# Patient Record
Sex: Female | Born: 1965 | Marital: Married | Smoking: Current every day smoker
Health system: Southern US, Community
[De-identification: ages and names within clinical notes are randomized; demographics above are authoritative.]

## PROBLEM LIST (undated history)

## (undated) DIAGNOSIS — I10 Essential (primary) hypertension: Secondary | ICD-10-CM

## (undated) DIAGNOSIS — C801 Malignant (primary) neoplasm, unspecified: Secondary | ICD-10-CM

## (undated) HISTORY — PX: COLON SURGERY: SHX602

---

## 2015-05-26 ENCOUNTER — Emergency Department
Admission: EM | Admit: 2015-05-26 | Discharge: 2015-05-26 | Disposition: A | Discharge status: Home / Self Care | Payer: Self-pay | Attending: Emergency Medicine | Admitting: Emergency Medicine

## 2015-05-26 ENCOUNTER — Emergency Department: Payer: Self-pay

## 2015-05-26 DIAGNOSIS — M5412 Radiculopathy, cervical region: Secondary | ICD-10-CM | POA: Insufficient documentation

## 2015-05-26 DIAGNOSIS — Z79899 Other long term (current) drug therapy: Secondary | ICD-10-CM | POA: Insufficient documentation

## 2015-05-26 DIAGNOSIS — Z880 Allergy status to penicillin: Secondary | ICD-10-CM | POA: Insufficient documentation

## 2015-05-26 DIAGNOSIS — I10 Essential (primary) hypertension: Secondary | ICD-10-CM | POA: Insufficient documentation

## 2015-05-26 DIAGNOSIS — Z720 Tobacco use: Secondary | ICD-10-CM | POA: Insufficient documentation

## 2015-05-26 HISTORY — DX: Malignant (primary) neoplasm, unspecified: C80.1

## 2015-05-26 HISTORY — DX: Essential (primary) hypertension: I10

## 2015-05-26 LAB — BASIC METABOLIC PANEL
Anion gap: 7 (ref 5–15)
BUN: 13 mg/dL (ref 6–20)
CALCIUM: 9 mg/dL (ref 8.9–10.3)
CHLORIDE: 107 mmol/L (ref 101–111)
CO2: 25 mmol/L (ref 22–32)
CREATININE: 0.77 mg/dL (ref 0.44–1.00)
GFR calc Af Amer: >60 mL/min (ref >60)
GFR calc non Af Amer: >60 mL/min (ref >60)
GLUCOSE: 98 mg/dL (ref 65–99)
Potassium: 3.6 mmol/L (ref 3.5–5.1)
Sodium: 139 mmol/L (ref 135–145)

## 2015-05-26 LAB — CBC
HCT: 40.4 % (ref 35.0–47.0)
Hemoglobin: 13.7 g/dL (ref 12.0–16.0)
MCH: 29.7 pg (ref 26.0–34.0)
MCHC: 34 g/dL (ref 32.0–36.0)
MCV: 87.3 fL (ref 80.0–100.0)
PLATELETS: 315 10*3/uL (ref 150–440)
RBC: 4.63 MIL/uL (ref 3.80–5.20)
RDW: 13.6 % (ref 11.5–14.5)
WBC: 13.4 10*3/uL — ABNORMAL HIGH (ref 3.6–11.0)

## 2015-05-26 LAB — TROPONIN I

## 2015-05-26 MED ORDER — NAPROXEN 500 MG PO TABS: 500.0000 mg | ORAL_TABLET | Freq: Two times a day (BID) | ORAL | Status: AC

## 2015-05-26 MED ORDER — HYDROCODONE-ACETAMINOPHEN 5-325 MG PO TABS
ORAL_TABLET | ORAL | Status: AC
  Filled 2015-05-26: qty 1

## 2015-05-26 MED ORDER — TRAMADOL HCL 50 MG PO TABS
50.0000 mg | ORAL_TABLET | Freq: Once | ORAL | Status: AC
Start: 2015-05-26 — End: 2015-05-26
  Administered 2015-05-26: 50 mg via ORAL
  Filled 2015-05-26: qty 1

## 2015-05-26 NOTE — ED Provider Notes (Signed)
[facility] Emergency Department Provider Note  ____________________________________________  Time seen: 7:30 PM  I have reviewed the triage vital signs and the nursing notes.   HISTORY  Chief Complaint Chest Pain    HPI Terri Barber is a 49 y.o. female who presents with complaints of left-sided neck pain when she woke up this morning at approximately 2 AM. She felt some mild tingling in her left arm which made her anxious and then she developed reflux sensations in her chest which have long since resolved. She denies heaviness or weakness in the arm. She denies fevers chills. She continues to have the posterior left-sided neck pain with tingling in her arm. She is never had this before. She denies injury or trauma. She denies repetitive use.She denies a history of heart disease     Past Medical History  Diagnosis Date  . Hypertension   . Cancer ([place])     There are no active problems to display for this patient.   Past Surgical History  Procedure Laterality Date  . Colon surgery      Current Outpatient Rx  Name  Route  Sig  Dispense  Refill  . albuterol (PROVENTIL HFA;VENTOLIN HFA) 108 (90 BASE) MCG/ACT inhaler   Inhalation   Inhale 2 puffs into the lungs every 6 (six) hours as needed for wheezing or shortness of breath.         . Cholecalciferol (VITAMIN D3) 50000 UNITS CAPS   Oral   Take 50,000 Units by mouth once a week. Take on Monday.         Marland Kitchen omeprazole (PRILOSEC) 20 MG capsule   Oral   Take 20 mg by mouth every morning.         . sertraline (ZOLOFT) 100 MG tablet   Oral   Take 50 mg by mouth every morning.         . traZODone (DESYREL) 50 MG tablet   Oral   Take 75 mg by mouth at bedtime.         . naproxen (NAPROSYN) 500 MG tablet   Oral   Take 1 tablet (500 mg total) by mouth 2 (two) times daily with a meal.   20 tablet   2     Allergies Penicillins; Inh; Tetracyclines & related; and Vicodin  History reviewed.  No pertinent family history.  Social History Social History  Substance Use Topics  . Smoking status: Current Every Day Smoker -- 10 years    Types: Cigarettes  . Smokeless tobacco: None  . Alcohol Use: Yes     Comment: social    Review of Systems  Constitutional: Negative for fever. Eyes: Negative for visual changes. ENT: Negative for sore throat Cardiovascular: Negative for chest pain. Respiratory: Negative for shortness of breath. Gastrointestinal: Negative for abdominal pain, vomiting and diarrhea. Genitourinary: Negative for dysuria. Musculoskeletal: Negative for back pain. Positive for left neck pain Skin: Negative for rash. Neurological: Negative for headaches or focal weakness Psychiatric: No anxiety    ____________________________________________   PHYSICAL EXAM:  VITAL SIGNS: ED Triage Vitals  Enc Vitals Group     BP 05/26/15 1855 133/84 mmHg     Pulse Rate 05/26/15 1855 81     Resp 05/26/15 1855 18     Temp 05/26/15 1855 97.8 F (36.6 C)     Temp Source 05/26/15 1855 Oral     SpO2 05/26/15 1855 98 %     Weight 05/26/15 1855 209 lb (94.802 kg)  Height 05/26/15 1855 5' 6.5" (1.689 m)     Head Cir --      Peak Flow --      Pain Score 05/26/15 1913 9     Pain Loc --      Pain Edu? --      Excl. in [place]? --      Constitutional: Alert and oriented. Well appearing and in no distress. Eyes: Conjunctivae are normal.  ENT   Head: Normocephalic and atraumatic.   Mouth/Throat: Mucous membranes are moist. Cardiovascular: Normal rate, regular rhythm. Normal and symmetric distal pulses are present in all extremities. No murmurs, rubs, or gallops. Respiratory: Normal respiratory effort without tachypnea nor retractions. Breath sounds are clear and equal bilaterally.  Gastrointestinal: Soft and non-tender in all quadrants. No distention. There is no CVA tenderness. Genitourinary: deferred Musculoskeletal: Nontender with normal range of motion in all  extremities. No lower extremity tenderness nor edema. Neurologic:  Normal speech and language. No gross focal neurologic deficits are appreciated. Skin:  Skin is warm, dry and intact. No rash noted. Psychiatric: Mood and affect are normal. Patient exhibits appropriate insight and judgment.  ____________________________________________    LABS (pertinent positives/negatives)  Labs Reviewed  CBC - Abnormal; Notable for the following:    WBC 13.4 (*)    All other components within normal limits  BASIC METABOLIC PANEL  TROPONIN I    ____________________________________________   EKG  ED ECG REPORT I, Lavonia Drafts, the attending physician, personally viewed and interpreted this ECG.  Date: 05/26/2015 EKG Time:  7:07 PM Rate: 71 Rhythm: normal sinus rhythm QRS Axis: normal Intervals: normal ST/T Wave abnormalities: normal Conduction Disutrbances: none Narrative Interpretation: unremarkable   ____________________________________________    RADIOLOGY I have personally reviewed any xrays that were ordered on this patient: No acute disease  ____________________________________________   PROCEDURES  Procedure(s) performed: none  Critical Care performed: none  ____________________________________________   INITIAL IMPRESSION / ASSESSMENT AND PLAN / ED COURSE  Pertinent labs & imaging results that were available during my care of the patient were reviewed by me and considered in my medical decision making (see chart for details).  Patient well-appearing. Her EKG is completely normal and her initial troponin is normal as well. Her history of present illness is not consistent with ACS and she is PE RC negative. Her symptoms seem most consistent with cervical radiculopathy. She does have mild elevation of her white blood cell count of unknown significance. Her chest x-ray is unremarkable. Feel that she is appropriate for discharge at this time. I asked her to take  NSAIDs for her pain but if not better in 48-72 hours she will need a recheck and the patient agrees with this plan  ____________________________________________   FINAL CLINICAL IMPRESSION(S) / ED DIAGNOSES  Final diagnoses:  Cervical radiculopathy     Lavonia Drafts, MD 05/26/15 2122

## 2015-05-26 NOTE — ED Notes (Signed)
Pt c/o chest pain, jaw, left arm pain since 8 am. States woke from sleep.

## 2015-05-26 NOTE — Discharge Instructions (Signed)

## 2016-10-05 IMAGING — CR DG CHEST 2V
1 series · 2 of 2 positions shown · non-contrast
Comparison: None.

CLINICAL DATA: 49-year-old female with acute chest pain and
shortness of breath today.

EXAM:
CHEST  2 VIEW

[Series 1: dg chest 2 view · 0.14mm/px · 2 of 2 slices shown]
[im 1/2]
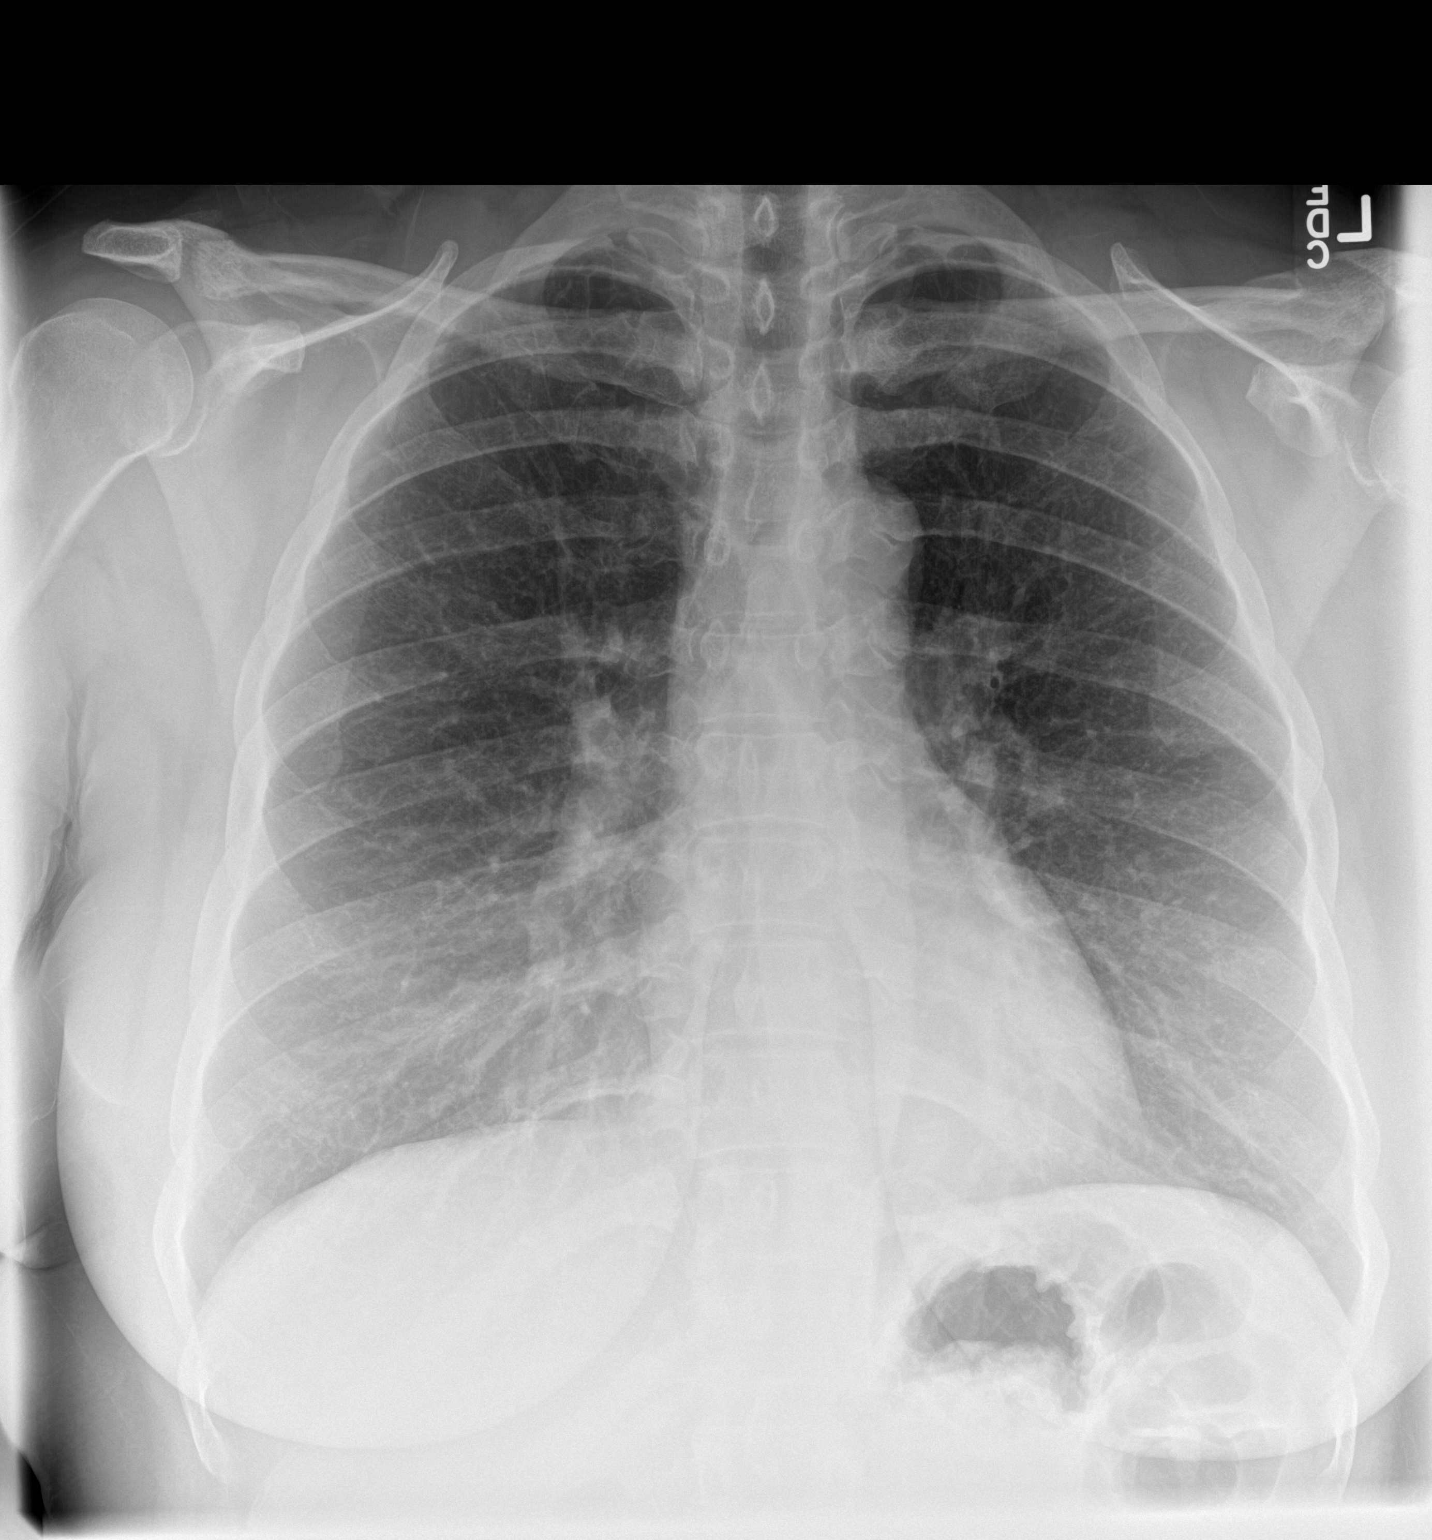
[im 2/2]
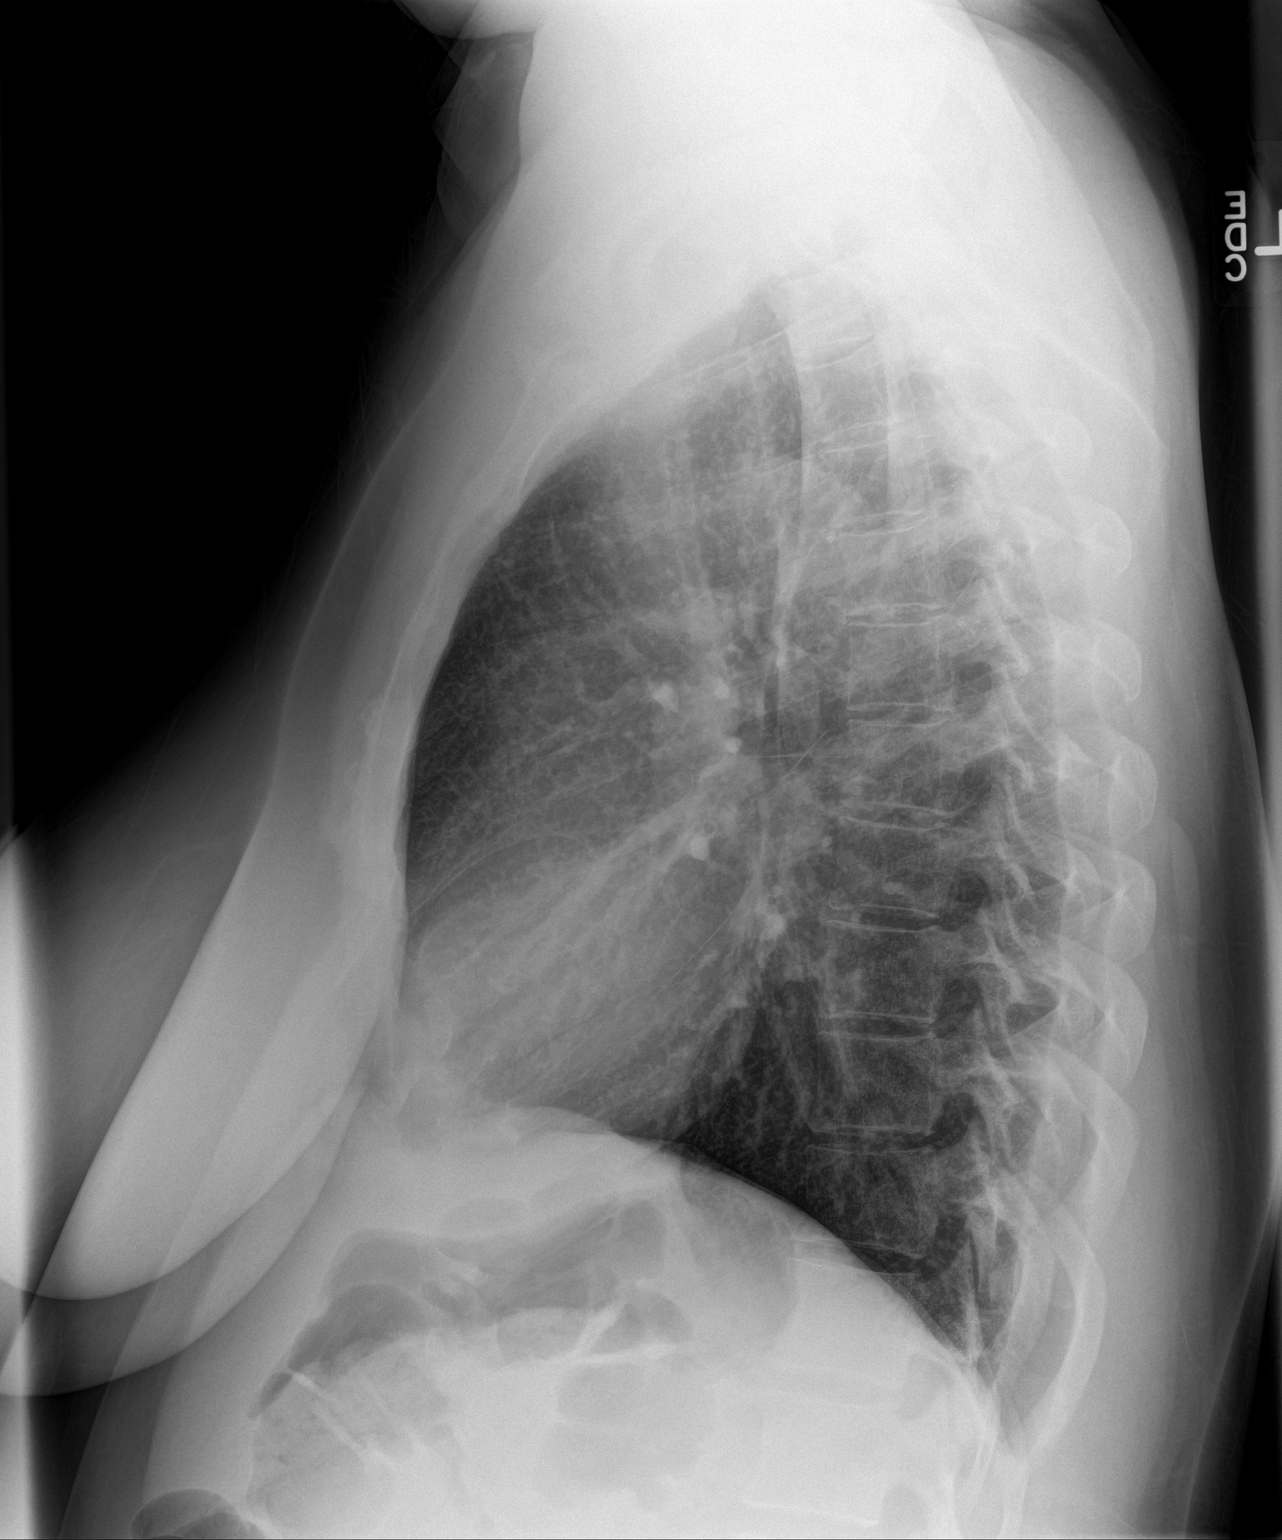

[2 of 2 positions shown; findings below may reference images not displayed]

FINDINGS: The cardiomediastinal silhouette is unremarkable.

Mild peribronchial thickening is noted.

There is no evidence of focal airspace disease, pulmonary edema,
suspicious pulmonary nodule/mass, pleural effusion, or pneumothorax.
No acute bony abnormalities are identified.
IMPRESSION: Mild peribronchial thickening of uncertain chronicity. No other
significant abnormalities.
